# Patient Record
Sex: Female | Born: 2000
Health system: Southern US, Community
[De-identification: ages and names within clinical notes are randomized; demographics above are authoritative.]

## PROBLEM LIST (undated history)

## (undated) DIAGNOSIS — F419 Anxiety disorder, unspecified: Secondary | ICD-10-CM

## (undated) DIAGNOSIS — F319 Bipolar disorder, unspecified: Secondary | ICD-10-CM

## (undated) HISTORY — PX: NASAL SEPTUM SURGERY: SHX37

## (undated) HISTORY — PX: TONSILLECTOMY: SHX5217

## (undated) HISTORY — DX: Bipolar disorder, unspecified: F31.9

## (undated) HISTORY — PX: MOUTH SURGERY: SHX715

## (undated) HISTORY — DX: Anxiety disorder, unspecified: F41.9

---
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
## 2022-11-30 ENCOUNTER — Ambulatory Visit (INDEPENDENT_AMBULATORY_CARE_PROVIDER_SITE_OTHER): Payer: Medicaid Other | Admitting: Obstetrics and Gynecology

## 2022-11-30 ENCOUNTER — Encounter: Payer: Self-pay | Admitting: Obstetrics and Gynecology

## 2022-11-30 VITALS — BP 111/75 | HR 80 | Ht 63.0 in | Wt 132.0 lb

## 2022-11-30 DIAGNOSIS — L739 Follicular disorder, unspecified: Secondary | ICD-10-CM

## 2022-11-30 NOTE — Progress Notes (Signed)
   NEW GYNECOLOGY VISIT  Subjective:  Alexandra Washington is a 22 y.o. G0 w/ nexplanon in place presenting for discussion of vaginal bumps  Reviewed note from Jorge Ny on 09/07/22: "vaginal Pain x 1 month w/ ingrown hair on vaginal lip. She has a lump, looks purple and fluctuate in size. Today, it is a size of pea and got as large as a big marble. She squeeze it and a lot of blood came out. No pain today. She does not use nair or shave, but uses hard wax. Pt has had a "cyst" or "lump" that has been present the last month. On the labia majora "right in the middle". Can be painful, soreness. Mostly just concerned it is there. She did squeeze it (one time for an hour straight). Says it will drain blood, neve pus. When she squeezes it it will come back. Never any pus. She does wax, believes it is non scented. Uses Dove unscented soaps. No lotions or vaginal douching. Exam - On the lower L mons pubis above labia majora there is a palpable mass slightly larger than a pea. It is firm but movable. No overlying skin changes. Scattered pustules noted across mons pubis"  I reviewed the following results in [place] 07/30/22 HIV NR  09/07/22 GC/CT/trich/HSV negative  She was treated for folliculitis with doxycycline.  Today, Alexandra Washington confirms the above history. Denies any n/v/f/c.   PMH: bipolar disorder, anxiety, headaches PSH: no abd/pelvic surgeries Meds: lamictal, trazodone, lexapro, Nexplanon All: NKDA Pap Hx: n/a  Objective:   Vitals:   11/30/22 1052  BP: 111/75  Pulse: 80  Weight: 132 lb (59.9 kg)  Height: 5\' 3"  (1.6 m)   General:  Alert, oriented and cooperative. Patient is in no acute distress.  Skin: Skin is warm and dry. No rash noted.   Cardiovascular: Normal heart rate noted  Respiratory: Normal respiratory effort, no problems with respiration noted  Abdomen: Soft, nontender.  Pelvic: Performed in presence of chaperone.  Mons with several erythematous papules. Area of pt's  concern slightly hyperpigmented, <53mm and firm but no palpable nodule or cyst beneath. No e/o bartholin's cyst based on area of concern, but internal digital exam deferred.    Assessment and Plan:  Alexandra Washington is a 22 y.o. with folliculitis  Discussed that findings are overall consistent with folliculitis or irritation from hair removal. There is no evidence of acute infection or abscess at this time, so I would not recommend antibiotics. Area of concern appears more like scarred tissue rather than an infectious process. Would not excise especially due to small size. We reviewed clipping hair instead of waxing/shaving, using barrier creams and unscented gentle moisturizers over the area of irritated skin. Reviewed using warm compresses prn if certain areas seem to flare.  Follow up prn if new area develops or worsens  Return in about 6 months (around 05/31/2023) for annual exam with pap .  No future appointments.  Inez Catalina, MD

## 2022-11-30 NOTE — Progress Notes (Signed)
Pt states she was referred from PCP for ingrown hair.  Pt has taken abx in the past.  Pt states she does wax the area.

## 2023-03-25 ENCOUNTER — Other Ambulatory Visit: Payer: Self-pay

## 2023-03-25 DIAGNOSIS — M791 Myalgia, unspecified site: Secondary | ICD-10-CM | POA: Insufficient documentation

## 2023-03-25 DIAGNOSIS — N61 Mastitis without abscess: Secondary | ICD-10-CM | POA: Insufficient documentation

## 2023-03-25 DIAGNOSIS — W57XXXA Bitten or stung by nonvenomous insect and other nonvenomous arthropods, initial encounter: Secondary | ICD-10-CM | POA: Diagnosis not present

## 2023-03-25 DIAGNOSIS — S20162A Insect bite (nonvenomous) of breast, left breast, initial encounter: Secondary | ICD-10-CM | POA: Diagnosis not present

## 2023-03-25 DIAGNOSIS — L539 Erythematous condition, unspecified: Secondary | ICD-10-CM | POA: Diagnosis present

## 2023-03-25 LAB — CBC WITH DIFFERENTIAL/PLATELET
Abs Immature Granulocytes: 0.01 10*3/uL (ref 0.00–0.07)
Basophils Absolute: 0 10*3/uL (ref 0.0–0.1)
Basophils Relative: 0 %
Eosinophils Absolute: 0.1 10*3/uL (ref 0.0–0.5)
Eosinophils Relative: 1 %
HCT: 41.1 % (ref 36.0–46.0)
Hemoglobin: 15 g/dL (ref 12.0–15.0)
Immature Granulocytes: 0 %
Lymphocytes Relative: 38 %
Lymphs Abs: 3.2 10*3/uL (ref 0.7–4.0)
MCH: 31.1 pg (ref 26.0–34.0)
MCHC: 36.5 g/dL — ABNORMAL HIGH (ref 30.0–36.0)
MCV: 85.3 fL (ref 80.0–100.0)
Monocytes Absolute: 0.5 10*3/uL (ref 0.1–1.0)
Monocytes Relative: 6 %
Neutro Abs: 4.5 10*3/uL (ref 1.7–7.7)
Neutrophils Relative %: 55 %
Platelets: 301 10*3/uL (ref 150–400)
RBC: 4.82 MIL/uL (ref 3.87–5.11)
RDW: 12.3 % (ref 11.5–15.5)
WBC: 8.3 10*3/uL (ref 4.0–10.5)
nRBC: 0 % (ref 0.0–0.2)

## 2023-03-25 LAB — BASIC METABOLIC PANEL
Anion gap: 10 (ref 5–15)
BUN: 12 mg/dL (ref 6–20)
CO2: 26 mmol/L (ref 22–32)
Calcium: 9.6 mg/dL (ref 8.9–10.3)
Chloride: 103 mmol/L (ref 98–111)
Creatinine, Ser: 0.85 mg/dL (ref 0.44–1.00)
GFR, Estimated: >60 mL/min (ref >60)
Glucose, Bld: 103 mg/dL — ABNORMAL HIGH (ref 70–99)
Potassium: 3.4 mmol/L — ABNORMAL LOW (ref 3.5–5.1)
Sodium: 139 mmol/L (ref 135–145)

## 2023-03-25 NOTE — ED Triage Notes (Addendum)
Pt reports a bug bite on her left breast area last Tuesday that is still red. She c/o soreness and achy on her neck/back. Reports of hot flashes.

## 2023-03-25 NOTE — ED Provider Triage Note (Signed)
Emergency Medicine Provider Triage Evaluation Note  Alexandra Washington , a 22 y.o. female  was evaluated in triage.  Pt complains of bug bite to left chest 6 days ago. States area has become progressively erythematous with diffuse body aches worse to neck and back today. No fever, nausea, or vomiting. Unknown what bit her.  Review of Systems  Positive: See HPI Negative: See HPI  Physical Exam  BP 117/83 (BP Location: Right Arm)   Pulse (!) 110   Temp 98.5 F (36.9 C) (Oral)   Resp 16   SpO2 100%  Gen:   Awake, no distress   Resp:  Normal effort LCTA MSK:   Moves extremities without difficulty  Other:  Small area irregular increased erythema to left chest wall, no induration or fluctuance, no open wound, no increased warmth, diffuse anterior chest wall minimally erythematous  Medical Decision Making  Medically screening exam initiated at 10:37 PM.  Appropriate orders placed.  Alexandra Washington was informed that the remainder of the evaluation will be completed by another provider, this initial triage assessment does not replace that evaluation, and the importance of remaining in the ED until their evaluation is complete.     Alexandra Lederer, PA-C 03/25/23 2238

## 2023-03-26 ENCOUNTER — Emergency Department (HOSPITAL_BASED_OUTPATIENT_CLINIC_OR_DEPARTMENT_OTHER)
Admission: EM | Admit: 2023-03-26 | Discharge: 2023-03-26 | Disposition: A | Discharge status: Home / Self Care | Payer: Medicaid Other | Attending: Emergency Medicine | Admitting: Emergency Medicine

## 2023-03-26 DIAGNOSIS — M791 Myalgia, unspecified site: Secondary | ICD-10-CM

## 2023-03-26 DIAGNOSIS — N61 Mastitis without abscess: Secondary | ICD-10-CM

## 2023-03-26 DIAGNOSIS — S20162A Insect bite (nonvenomous) of breast, left breast, initial encounter: Secondary | ICD-10-CM

## 2023-03-26 MED ORDER — IBUPROFEN 600 MG PO TABS: 600.0000 mg | ORAL_TABLET | Freq: Four times a day (QID) | ORAL | 0 refills | Status: AC | PRN

## 2023-03-26 MED ORDER — IBUPROFEN 400 MG PO TABS
600.0000 mg | ORAL_TABLET | Freq: Once | ORAL | Status: AC
  Administered 2023-03-26: 600 mg via ORAL
  Filled 2023-03-26: qty 1

## 2023-03-26 MED ORDER — DOXYCYCLINE HYCLATE 100 MG PO CAPS: 100.0000 mg | ORAL_CAPSULE | Freq: Two times a day (BID) | ORAL | 0 refills | Status: AC

## 2023-03-26 MED ORDER — DOXYCYCLINE HYCLATE 100 MG PO TABS
100.0000 mg | ORAL_TABLET | Freq: Once | ORAL | Status: AC
  Administered 2023-03-26: 100 mg via ORAL
  Filled 2023-03-26: qty 1

## 2023-03-26 NOTE — ED Provider Notes (Signed)
Alexandra Washington  Provider Note  CSN: [id number] Arrival date & time: 03/25/23 2217  History Chief Complaint  Patient presents with   Insect Bite    Alexandra Washington is a 22 y.o. female with no significant PMH reports she was bitten by an unknown insect on her L breast about 6 days ago. Has has persistent redness and discomfort to that area since. She has also had diffuse body aches worsening today. No known fever. No muscle weakness.    Home Medications Prior to Admission medications   Medication Sig Start Date End Date Taking? Authorizing Provider  doxycycline (VIBRAMYCIN) 100 MG capsule Take 1 capsule (100 mg total) by mouth 2 (two) times daily. 03/26/23  Yes Pollyann Savoy, MD  ibuprofen (ADVIL) 600 MG tablet Take 1 tablet (600 mg total) by mouth every 6 (six) hours as needed. 03/26/23  Yes Pollyann Savoy, MD  acetaminophen (TYLENOL) 500 MG tablet Take 1 tablet (500 mg total) by mouth every 6 (six) hours as needed. 10/21/20   Merrilee Jansky, MD  escitalopram (LEXAPRO) 20 MG tablet Take 20 mg by mouth daily.    [provider]  fluticasone (FLONASE) 50 MCG/ACT nasal spray Place 1 spray into both nostrils daily. 10/21/20   Merrilee Jansky, MD  lamoTRIgine (LAMICTAL) 25 MG tablet Take 25 mg by mouth daily.    [provider]  Multiple Vitamin (MULTIVITAMIN WITH MINERALS) TABS tablet Take 1 tablet by mouth daily. Patient not taking: Reported on 11/30/2022    [provider]  traZODone (DESYREL) 50 MG tablet Take 50 mg by mouth at bedtime.    [provider]  cyproheptadine (PERIACTIN) 4 MG tablet Take 1-1/2 tablets at bedtime Patient not taking: Reported on 11/15/2018 04/25/16 10/21/20  Keturah Shavers, MD     Allergies    Aleve [naproxen sodium]   Review of Systems   Review of Systems Please see HPI for pertinent positives and negatives  Physical Exam BP 116/70   Pulse 77   Temp 98.3 F (36.8 C)  (Oral)   Resp 17   SpO2 100%   Physical Exam Vitals and nursing note reviewed. Exam conducted with a chaperone present.  Constitutional:      Appearance: Normal appearance.  HENT:     Head: Normocephalic and atraumatic.     Nose: Nose normal.     Mouth/Throat:     Mouth: Mucous membranes are moist.  Eyes:     Extraocular Movements: Extraocular movements intact.     Conjunctiva/sclera: Conjunctivae normal.  Cardiovascular:     Rate and Rhythm: Normal rate.  Pulmonary:     Effort: Pulmonary effort is normal.     Breath sounds: Normal breath sounds.     Comments: Mild erythema and warmth to L upper breast, no focal fluctuance Abdominal:     General: Abdomen is flat.     Palpations: Abdomen is soft.     Tenderness: There is no abdominal tenderness.  Musculoskeletal:        General: No swelling. Normal range of motion.     Cervical back: Normal range of motion and neck supple. No rigidity.  Lymphadenopathy:     Cervical: No cervical adenopathy.  Skin:    General: Skin is warm and dry.  Neurological:     General: No focal deficit present.     Mental Status: She is alert and oriented to person, place, and time.     Cranial Nerves: No  cranial nerve deficit.     Sensory: No sensory deficit.     Motor: No weakness.  Psychiatric:        Mood and Affect: Mood normal.     ED Results / Procedures / Treatments   EKG None  Procedures Procedures  Medications Ordered in the ED Medications  ibuprofen (ADVIL) tablet 600 mg (has no administration in time range)  doxycycline (VIBRA-TABS) tablet 100 mg (has no administration in time range)    Initial Impression and Plan  Patient here with continued redness to area of recent insect bite concerning for developing cellulitis. She also has muscle aches but normal exam otherwise. Vitals are reassuring. Labs show normal CBC and BMP. No concern for systemic infection/sepsis. No evidence of an insect borne encephalopathy or myelitis. Will  treat with doxycycline, NSAIDs for body aches. Recommend PCP follow up, RTED for any other concerns.    ED Course       MDM Rules/Calculators/A&P Medical Decision Making Problems Addressed: Cellulitis of left breast: acute illness or injury Insect bite of left breast, initial encounter: acute illness or injury Myalgia: acute illness or injury  Amount and/or Complexity of Data Reviewed Labs: ordered. Decision-making details documented in ED Course.  Risk Prescription drug management.     Final Clinical Impression(s) / ED Diagnoses Final diagnoses:  Cellulitis of left breast  Insect bite of left breast, initial encounter  Myalgia    Rx / DC Orders ED Discharge Orders          Ordered    doxycycline (VIBRAMYCIN) 100 MG capsule  2 times daily        03/26/23 0256    ibuprofen (ADVIL) 600 MG tablet  Every 6 hours PRN        03/26/23 0256             Pollyann Savoy, MD 03/26/23 [phone]

## 2023-03-26 NOTE — ED Notes (Signed)
RN reviewed discharge instructions with pt. Pt verbalized understanding and had no further questions. VSS upon discharge.  

## 2023-06-24 ENCOUNTER — Encounter (HOSPITAL_BASED_OUTPATIENT_CLINIC_OR_DEPARTMENT_OTHER): Payer: Self-pay

## 2023-06-24 ENCOUNTER — Emergency Department (HOSPITAL_BASED_OUTPATIENT_CLINIC_OR_DEPARTMENT_OTHER)
Admission: EM | Admit: 2023-06-24 | Discharge: 2023-06-24 | Disposition: A | Discharge status: Home / Self Care | Payer: Medicaid Other | Attending: Emergency Medicine | Admitting: Emergency Medicine

## 2023-06-24 ENCOUNTER — Other Ambulatory Visit: Payer: Self-pay

## 2023-06-24 DIAGNOSIS — Z4801 Encounter for change or removal of surgical wound dressing: Secondary | ICD-10-CM | POA: Diagnosis present

## 2023-06-24 DIAGNOSIS — S51802D Unspecified open wound of left forearm, subsequent encounter: Secondary | ICD-10-CM | POA: Insufficient documentation

## 2023-06-24 DIAGNOSIS — Z5189 Encounter for other specified aftercare: Secondary | ICD-10-CM

## 2023-06-24 DIAGNOSIS — X58XXXD Exposure to other specified factors, subsequent encounter: Secondary | ICD-10-CM | POA: Insufficient documentation

## 2023-06-24 NOTE — Discharge Instructions (Addendum)
Please change dressing every 2 days.  Wash the wound and the skin around that area carefully with soap and water.  Apply bacitracin to the wound. Take tylenol/ibuprofen for pain. I recommend close follow-up with PCP for reevaluation.  Please do not hesitate to return to emergency department if worrisome signs symptoms we discussed become apparent.

## 2023-06-24 NOTE — ED Provider Notes (Signed)
[facility] AT [facility] Provider Note   CSN: [id number] Arrival date & time: 06/24/23  1020     History  Chief Complaint  Patient presents with   Wound Check    Alexandra Washington is a 22 y.o. female otherwise healthy presents today for a wound check.  Patient had a Nexplanon implant removal 3 days ago.  She has some Steri-Strips on the wound.  She was told that the Steri-Strip will fall off on its own.  States that since then she says has some itching and pain and some bleeding from the wound.  Bleeding is controlled in the ER.  Denies any fever.  Denies any loss of discharge in his arm.   Wound Check    Past Medical History:  Diagnosis Date   Anxiety    Bipolar disorder [facility])    Past Surgical History:  Procedure Laterality Date   MOUTH SURGERY     NASAL SEPTUM SURGERY     TONSILLECTOMY       Home Medications Prior to Admission medications   Medication Sig Start Date End Date Taking? Authorizing Provider  acetaminophen (TYLENOL) 500 MG tablet Take 1 tablet (500 mg total) by mouth every 6 (six) hours as needed. 10/21/20   Lamptey, Britta Mccreedy, MD  doxycycline (VIBRAMYCIN) 100 MG capsule Take 1 capsule (100 mg total) by mouth 2 (two) times daily. 03/26/23   Pollyann Savoy, MD  escitalopram (LEXAPRO) 20 MG tablet Take 20 mg by mouth daily.    [provider]  fluticasone (FLONASE) 50 MCG/ACT nasal spray Place 1 spray into both nostrils daily. 10/21/20   Merrilee Jansky, MD  ibuprofen (ADVIL) 600 MG tablet Take 1 tablet (600 mg total) by mouth every 6 (six) hours as needed. 03/26/23   Pollyann Savoy, MD  lamoTRIgine (LAMICTAL) 25 MG tablet Take 25 mg by mouth daily.    [provider]  Multiple Vitamin (MULTIVITAMIN WITH MINERALS) TABS tablet Take 1 tablet by mouth daily. Patient not taking: Reported on 11/30/2022    [provider]  traZODone (DESYREL) 50 MG tablet Take 50 mg by mouth at bedtime.    [provider]  cyproheptadine (PERIACTIN) 4 MG tablet Take 1-1/2 tablets at bedtime Patient not taking: Reported on 11/15/2018 04/25/16 10/21/20  Keturah Shavers, MD      Allergies    Aleve [naproxen sodium]    Review of Systems   Review of Systems Negative except as per HPI.  Physical Exam Updated Vital Signs BP 111/84 (BP Location: Right Arm)   Pulse (!) 108   Temp 98.4 F (36.9 C)   Resp 18   Ht 5\' 3"  (1.6 m)   Wt 61.7 kg   SpO2 100%   BMI 24.09 kg/m  Physical Exam Vitals and nursing note reviewed.  Constitutional:      Appearance: Normal appearance.  HENT:     Head: Normocephalic and atraumatic.     Mouth/Throat:     Mouth: Mucous membranes are moist.  Eyes:     General: No scleral icterus. Cardiovascular:     Rate and Rhythm: Normal rate and regular rhythm.     Pulses: Normal pulses.     Heart sounds: Normal heart sounds.  Pulmonary:     Effort: Pulmonary effort is normal.     Breath sounds: Normal breath sounds.  Abdominal:     General: Abdomen is flat.     Palpations: Abdomen is soft.     Tenderness:  There is no abdominal tenderness.  Musculoskeletal:        General: No deformity.  Skin:    General: Skin is warm.     Findings: No rash.     Comments: Well-healing 0.5 cm superficial wound on the left forearm.  No bleeding noted.  Very mild overlying skin erythema.  Neurological:     General: No focal deficit present.     Mental Status: She is alert.  Psychiatric:        Mood and Affect: Mood normal.     ED Results / Procedures / Treatments   Labs (all labs ordered are listed, but only abnormal results are displayed) Labs Reviewed - No data to display  EKG None  Radiology No results found.  Procedures Procedures    Medications Ordered in ED Medications - No data to display  ED Course/ Medical Decision Making/ A&P                                 Medical Decision Making  22 year old female presents for wound check.  Patient had  Nexplanon implant removal 3 days ago.  States that she has been having some itching, pain and bleeding from the wound.  On physical examination patient is afebrile and appears no acute distress.  There is a 0.5 cm superficial to the left upper arm.  No bleeding, no pus or fluid drainage noted.  There are some skin mild overlying skin erythema.  Low suspicions for cellulitis.  I removed the Steri-Strip as patient told me that she has had some itching and some skin allergic reaction to the Steri-Strips.  Wound appears to be well-healing and closely approximated.  I applied bacitracin and wrapped thee wound with a gauge.  Advised patient to follow-up with her PCP for reevaluation and management.  Strict return precaution discussed.   Disposition Continued outpatient therapy. Follow-up with PCP recommended for reevaluation of symptoms. Treatment plan discussed with patient.  Pt acknowledged understanding was agreeable to the plan. Worrisome signs and symptoms were discussed with patient, and patient acknowledged understanding to return to the ED if they noticed these signs and symptoms. Patient was stable upon discharge.   This chart was dictated using voice recognition software.  Despite best efforts to proofread,  errors can occur which can change the documentation meaning.          Final Clinical Impression(s) / ED Diagnoses Final diagnoses:  Visit for wound check    Rx / DC Orders ED Discharge Orders     None         Jeanelle Malling, [place] 06/24/23 1206    Terald Sleeper, MD 06/24/23 [phone]

## 2023-06-24 NOTE — ED Notes (Signed)
Pt given discharge instructions and reviewed wound care instructions. Supplies also given for patient for dressing changes. Opportunities given for questions. Pt verbalizes understanding. Jillyn Hidden, RN

## 2023-06-24 NOTE — ED Triage Notes (Signed)
Had birth control removed from arm on Friday.  Wound to upper left arm.  Steri stripped with some redness.  Sates has been bleeding

## 2024-03-31 ENCOUNTER — Emergency Department: Payer: Self-pay

## 2024-03-31 ENCOUNTER — Other Ambulatory Visit: Payer: Self-pay

## 2024-03-31 ENCOUNTER — Emergency Department
Admission: EM | Admit: 2024-03-31 | Discharge: 2024-03-31 | Disposition: A | Discharge status: Home / Self Care | Payer: Self-pay | Attending: Emergency Medicine | Admitting: Emergency Medicine

## 2024-03-31 DIAGNOSIS — R1031 Right lower quadrant pain: Secondary | ICD-10-CM | POA: Insufficient documentation

## 2024-03-31 DIAGNOSIS — R1084 Generalized abdominal pain: Secondary | ICD-10-CM

## 2024-03-31 DIAGNOSIS — R1032 Left lower quadrant pain: Secondary | ICD-10-CM | POA: Insufficient documentation

## 2024-03-31 LAB — HEPATIC FUNCTION PANEL
ALT: 16 U/L (ref 0–44)
AST: 14 U/L — ABNORMAL LOW (ref 15–41)
Albumin: 4.5 g/dL (ref 3.5–5.0)
Alkaline Phosphatase: 56 U/L (ref 38–126)
Bilirubin, Direct: 0.2 mg/dL (ref 0.0–0.2)
Indirect Bilirubin: 1.1 mg/dL — ABNORMAL HIGH (ref 0.3–0.9)
Total Bilirubin: 1.3 mg/dL — ABNORMAL HIGH (ref 0.0–1.2)
Total Protein: 7.2 g/dL (ref 6.5–8.1)

## 2024-03-31 LAB — URINALYSIS, ROUTINE W REFLEX MICROSCOPIC
Bilirubin Urine: NEGATIVE
Glucose, UA: NEGATIVE mg/dL
Hgb urine dipstick: NEGATIVE
Ketones, ur: 20 mg/dL — AB
Leukocytes,Ua: NEGATIVE
Nitrite: NEGATIVE
Protein, ur: 100 mg/dL — AB
Specific Gravity, Urine: 1.036 — ABNORMAL HIGH (ref 1.005–1.030)
pH: 6 (ref 5.0–8.0)

## 2024-03-31 LAB — CBC WITH DIFFERENTIAL/PLATELET
Abs Immature Granulocytes: 0.08 10*3/uL — ABNORMAL HIGH (ref 0.00–0.07)
Basophils Absolute: 0 10*3/uL (ref 0.0–0.1)
Basophils Relative: 0 %
Eosinophils Absolute: 0 10*3/uL (ref 0.0–0.5)
Eosinophils Relative: 0 %
HCT: 36.5 % (ref 36.0–46.0)
Hemoglobin: 12.7 g/dL (ref 12.0–15.0)
Immature Granulocytes: 1 %
Lymphocytes Relative: 5 %
Lymphs Abs: 0.8 10*3/uL (ref 0.7–4.0)
MCH: 30.7 pg (ref 26.0–34.0)
MCHC: 34.8 g/dL (ref 30.0–36.0)
MCV: 88.2 fL (ref 80.0–100.0)
Monocytes Absolute: 0.9 10*3/uL (ref 0.1–1.0)
Monocytes Relative: 5 %
Neutro Abs: 14.6 10*3/uL — ABNORMAL HIGH (ref 1.7–7.7)
Neutrophils Relative %: 89 %
Platelets: 236 10*3/uL (ref 150–400)
RBC: 4.14 MIL/uL (ref 3.87–5.11)
RDW: 12.2 % (ref 11.5–15.5)
WBC: 16.4 10*3/uL — ABNORMAL HIGH (ref 4.0–10.5)
nRBC: 0 % (ref 0.0–0.2)

## 2024-03-31 LAB — BASIC METABOLIC PANEL WITH GFR
Anion gap: 10 (ref 5–15)
BUN: 14 mg/dL (ref 6–20)
CO2: 24 mmol/L (ref 22–32)
Calcium: 8.9 mg/dL (ref 8.9–10.3)
Chloride: 106 mmol/L (ref 98–111)
Creatinine, Ser: 0.88 mg/dL (ref 0.44–1.00)
GFR, Estimated: >60 mL/min (ref >60)
Glucose, Bld: 91 mg/dL (ref 70–99)
Potassium: 3.4 mmol/L — ABNORMAL LOW (ref 3.5–5.1)
Sodium: 140 mmol/L (ref 135–145)

## 2024-03-31 LAB — POC URINE PREG, ED: Preg Test, Ur: NEGATIVE

## 2024-03-31 LAB — LIPASE, BLOOD: Lipase: 29 U/L (ref 11–51)

## 2024-03-31 MED ORDER — IOHEXOL 300 MG/ML  SOLN
100.0000 mL | Freq: Once | INTRAMUSCULAR | Status: AC | PRN
  Administered 2024-03-31: 100 mL via INTRAVENOUS

## 2024-03-31 MED ORDER — SODIUM CHLORIDE 0.9 % IV BOLUS
1000.0000 mL | Freq: Once | INTRAVENOUS | Status: AC
  Administered 2024-03-31: 1000 mL via INTRAVENOUS

## 2024-03-31 MED ORDER — ACETAMINOPHEN 500 MG PO TABS
1000.0000 mg | ORAL_TABLET | Freq: Once | ORAL | Status: AC
  Administered 2024-03-31: 1000 mg via ORAL
  Filled 2024-03-31: qty 2

## 2024-03-31 MED ORDER — MORPHINE SULFATE (PF) 4 MG/ML IV SOLN
4.0000 mg | Freq: Once | INTRAVENOUS | Status: AC
  Administered 2024-03-31: 4 mg via INTRAVENOUS
  Filled 2024-03-31: qty 1

## 2024-03-31 MED ORDER — HYOSCYAMINE SULFATE 0.125 MG SL SUBL: 0.1250 mg | SUBLINGUAL_TABLET | SUBLINGUAL | 0 refills | Status: AC | PRN

## 2024-03-31 MED ORDER — MAGNESIUM CITRATE PO SOLN: 1.0000 | Freq: Once | ORAL | 0 refills | Status: AC

## 2024-03-31 NOTE — ED Triage Notes (Signed)
 Pt here with abd pain since yesterday but vomiting since Sun morning. Pt states the pain is all lower and then radiates up her right flank. Pt stable in triage.

## 2024-03-31 NOTE — ED Provider Notes (Signed)
 [facility] Provider Note    Event Date/Time   First MD Initiated Contact with Patient 03/31/24 1036     (approximate)   History   Abdominal Pain   HPI  Alexandra Washington is a 23 y.o. female who presents today for evaluation of lower abdominal pain.  Patient reports that this began yesterday but began vomiting on Sunday.  He reports that the pain is primarily right-sided and radiates up into her flank.  She denies any burning with urination or vaginal discharge or bleeding.  No fevers or chills.  No diarrhea.  Patient Active Problem List   Diagnosis Date Noted   Nasal septal deviation 12/19/2016   Nasal turbinate hypertrophy 12/19/2016   Syncope 11/15/2015   Tension headache 05/12/2015   Migraine without aura and without status migrainosus, not intractable 05/12/2015          Physical Exam   Triage Vital Signs: ED Triage Vitals  Encounter Vitals Group     BP 03/31/24 1032 (!) 109/90     Systolic BP Percentile --      Diastolic BP Percentile --      Pulse Rate 03/31/24 1032 (!) 121     Resp 03/31/24 1032 17     Temp 03/31/24 1032 99.5 F (37.5 C)     Temp Source 03/31/24 1032 Oral     SpO2 03/31/24 1032 100 %     Weight 03/31/24 1033 136 lb 0.4 oz (61.7 kg)     Height 03/31/24 1033 5\' 3"  (1.6 m)     Head Circumference --      Peak Flow --      Pain Score 03/31/24 1033 10     Pain Loc --      Pain Education --      Exclude from Growth Chart --     Most recent vital signs: Vitals:   03/31/24 1032 03/31/24 1411  BP: (!) 109/90 (!) 93/52  Pulse: (!) 121 87  Resp: 17 16  Temp: 99.5 F (37.5 C)   SpO2: 100% 100%    Physical Exam Vitals and nursing note reviewed.  Constitutional:      General: Awake and alert. No acute distress.    Appearance: Normal appearance. The patient is normal weight.  HENT:     Head: Normocephalic and atraumatic.     Mouth: Mucous membranes are moist.  Eyes:     General: PERRL. Normal EOMs        Right  eye: No discharge.        Left eye: No discharge.     Conjunctiva/sclera: Conjunctivae normal.  Cardiovascular:     Rate and Rhythm: Tachycardic rate and regular rhythm.     Pulses: Normal pulses.  Pulmonary:     Effort: Pulmonary effort is normal. No respiratory distress.     Breath sounds: Normal breath sounds.  Abdominal:     Abdomen is soft. There is right lower quadrant, suprapubic, and left lower quadrant abdominal tenderness. No rebound or guarding. No distention. Musculoskeletal:        General: No swelling. Normal range of motion.     Cervical back: Normal range of motion and neck supple.  Skin:    General: Skin is warm and dry.     Capillary Refill: Capillary refill takes less than 2 seconds.     Findings: No rash.  Neurological:     Mental Status: The patient is awake and alert.      ED  Results / Procedures / Treatments   Labs (all labs ordered are listed, but only abnormal results are displayed) Labs Reviewed  CBC WITH DIFFERENTIAL/PLATELET - Abnormal; Notable for the following components:      Result Value   WBC 16.4 (*)    Neutro Abs 14.6 (*)    Abs Immature Granulocytes 0.08 (*)    All other components within normal limits  BASIC METABOLIC PANEL WITH GFR - Abnormal; Notable for the following components:   Potassium 3.4 (*)    All other components within normal limits  URINALYSIS, ROUTINE W REFLEX MICROSCOPIC - Abnormal; Notable for the following components:   Color, Urine AMBER (*)    APPearance HAZY (*)    Specific Gravity, Urine 1.036 (*)    Ketones, ur 20 (*)    Protein, ur 100 (*)    Bacteria, UA RARE (*)    All other components within normal limits  HEPATIC FUNCTION PANEL - Abnormal; Notable for the following components:   AST 14 (*)    Total Bilirubin 1.3 (*)    Indirect Bilirubin 1.1 (*)    All other components within normal limits  LIPASE, BLOOD  POC URINE PREG, ED     EKG     RADIOLOGY     PROCEDURES:  Critical Care performed:    Procedures   MEDICATIONS ORDERED IN ED: Medications  sodium chloride 0.9 % bolus 1,000 mL (1,000 mLs Intravenous New Bag/Given 03/31/24 1109)  morphine (PF) 4 MG/ML injection 4 mg (4 mg Intravenous Given 03/31/24 1204)  iohexol (OMNIPAQUE) 300 MG/ML solution 100 mL (100 mLs Intravenous Contrast Given 03/31/24 1429)     IMPRESSION / MDM / ASSESSMENT AND PLAN / ED COURSE  I reviewed the triage vital signs and the nursing notes.   Differential diagnosis includes, but is not limited to, appendicitis, ectopic pregnancy, pyelonephritis, nephrolithiasis, PID.  Patient is awake and alert, tachycardic to 121 but normotensive.  Temperature is 99.5 F.  Further workup is indicated.  Labs obtained reveal a leukocytosis to 16 with a neutrophilic predominance.  She has a NSAID allergy, therefore patient was given morphine instead.  Urine pregnancy is negative, urinalysis is clean.  Patient passed off to [facility] pending CT scan and final disposition.   Patient's presentation is most consistent with acute presentation with potential threat to life or bodily function.    FINAL CLINICAL IMPRESSION(S) / ED DIAGNOSES   Final diagnoses:  Lower abdominal pain     Rx / DC Orders   ED Discharge Orders     None        Note:  This document was prepared using Dragon voice recognition software and may include unintentional dictation errors.   Khyrin Trevathan E, PA-C 03/31/24 1519    Ruth Cove, MD 03/31/24 1534

## 2024-03-31 NOTE — ED Provider Notes (Signed)
-----------------------------------------   3:29 PM on 03/31/2024 -----------------------------------------  Blood pressure (!) 93/52, pulse 87, temperature 99.5 F (37.5 C), temperature source Oral, resp. rate 16, height 5' 3 (1.6 m), weight 61.7 kg, last menstrual period 03/05/2024, SpO2 100%.  Assuming care from Jenna Poggi, PA-C  In short, Alexandra Washington is a 23 y.o. female with a chief complaint of Abdominal Pain .  Refer to the original H&P for additional details.  The current plan of care is to await results of ultrasound.  ----------------------------------------- 5:33 PM on 03/31/2024 ----------------------------------------- Ultrasound of the right upper quadrant shows a distended gallbladder with no wall thickening, adjacent fluid, or shadowing stones.  No sludge.  Negative Murphy sign.  Pelvic ultrasound is normal.  Results reviewed with the patient and family.  Patient states that she believes that she is just constipated.  It has been about 3 days since she has had a bowel movement.  She reports frequent constipation. She was advised to take Magnesium  Citrate and then daily MiraLax if needed.   Leukocytosis of 16.4 with a neutrophil count of 14.6 is somewhat of concern however there are no other indications of infection identified on studies performed today and there is no focal tenderness of the abdomen. She is now tolerating water and saltines and has not vomited since being in the ER. She was encouraged to schedule follow-up appointment with her primary care provider.  If her symptoms change or worsen and she is unable to schedule an appointment, she was encouraged to return to the emergency department.        Sherryle Don, FNP 03/31/24 1743    Ruth Cove, MD 04/03/24 1511

## 2024-03-31 NOTE — Discharge Instructions (Addendum)
 Please follow up with your primary care provider if not improving over the next few days.  Take MiraLax or similar stool softener daily if needed.

## 2024-07-03 ENCOUNTER — Telehealth: Payer: Self-pay

## 2024-07-27 ENCOUNTER — Encounter: Payer: Self-pay | Admitting: Obstetrics

## 2024-09-07 ENCOUNTER — Other Ambulatory Visit: Payer: Self-pay

## 2024-09-07 ENCOUNTER — Emergency Department (HOSPITAL_COMMUNITY)
Admission: EM | Admit: 2024-09-07 | Discharge: 2024-09-07 | Disposition: A | Discharge status: Home / Self Care | Attending: Emergency Medicine | Admitting: Emergency Medicine

## 2024-09-07 ENCOUNTER — Emergency Department (HOSPITAL_COMMUNITY)

## 2024-09-07 DIAGNOSIS — M79644 Pain in right finger(s): Secondary | ICD-10-CM | POA: Diagnosis present

## 2024-09-07 DIAGNOSIS — M79641 Pain in right hand: Secondary | ICD-10-CM | POA: Diagnosis not present

## 2024-09-07 DIAGNOSIS — R1030 Lower abdominal pain, unspecified: Secondary | ICD-10-CM | POA: Diagnosis not present

## 2024-09-07 DIAGNOSIS — Y92410 Unspecified street and highway as the place of occurrence of the external cause: Secondary | ICD-10-CM | POA: Diagnosis not present

## 2024-09-07 DIAGNOSIS — R079 Chest pain, unspecified: Secondary | ICD-10-CM | POA: Diagnosis not present

## 2024-09-07 LAB — PREGNANCY, URINE: Preg Test, Ur: NEGATIVE

## 2024-09-07 MED ORDER — OXYCODONE HCL 5 MG PO TABS: 5.0000 mg | ORAL_TABLET | ORAL | 0 refills | Status: AC | PRN

## 2024-09-07 MED ORDER — OXYCODONE-ACETAMINOPHEN 5-325 MG PO TABS
1.0000 | ORAL_TABLET | Freq: Once | ORAL | Status: AC
  Administered 2024-09-07: 1 via ORAL
  Filled 2024-09-07: qty 1

## 2024-09-07 NOTE — Discharge Instructions (Addendum)
 As discussed, you will need to call Dr. Romona with hand surgery first thing in the morning for a follow-up appointment.  Please tell them that you are seen in the emergency room and referred to them.  Please also follow-up with your primary care in the next 48-72 hours.  Seek emergency care if experiencing any new or worsening symptoms.  Alternating between 650 mg Tylenol  and 400 mg Advil : The best way to alternate taking Acetaminophen  (example Tylenol ) and Ibuprofen  (example Advil /Motrin ) is to take them 3 hours apart. For example, if you take ibuprofen  at 6 am you can then take Tylenol  at 9 am. You can continue this regimen throughout the day, making sure you do not exceed the recommended maximum dose for each drug.
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
